# Patient Record
Sex: Male | Born: 1985 | Race: Black or African American | Hispanic: No | Marital: Single | Smoking: Current every day smoker
Health system: Southern US, Community
[De-identification: ages and names within clinical notes are randomized; demographics above are authoritative.]

## PROBLEM LIST (undated history)

## (undated) DIAGNOSIS — K219 Gastro-esophageal reflux disease without esophagitis: Secondary | ICD-10-CM

## (undated) HISTORY — PX: NO PAST SURGERIES: SHX2092

---
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
## 2007-04-06 ENCOUNTER — Emergency Department (HOSPITAL_COMMUNITY): Admission: EM | Admit: 2007-04-06 | Discharge: 2007-04-07 | Payer: Self-pay | Admitting: Emergency Medicine

## 2009-05-18 ENCOUNTER — Emergency Department (HOSPITAL_COMMUNITY): Admission: EM | Admit: 2009-05-18 | Discharge: 2009-05-18 | Payer: Self-pay | Admitting: Emergency Medicine

## 2009-06-04 ENCOUNTER — Emergency Department (HOSPITAL_COMMUNITY): Admission: EM | Admit: 2009-06-04 | Discharge: 2009-06-04 | Payer: Self-pay | Admitting: Emergency Medicine

## 2010-10-10 LAB — DIFFERENTIAL
Basophils Absolute: 0 10*3/uL (ref 0.0–0.1)
Basophils Relative: 0 % (ref 0–1)
Eosinophils Absolute: 0.1 10*3/uL (ref 0.0–0.7)
Lymphocytes Relative: 27 % (ref 12–46)
Lymphs Abs: 1.9 10*3/uL (ref 0.7–4.0)
Monocytes Absolute: 1.1 10*3/uL — ABNORMAL HIGH (ref 0.1–1.0)
Monocytes Relative: 15 % — ABNORMAL HIGH (ref 3–12)
Neutro Abs: 4.1 10*3/uL (ref 1.7–7.7)

## 2010-10-10 LAB — POCT I-STAT, CHEM 8
BUN: 13 mg/dL (ref 6–23)
HCT: 46 % (ref 39.0–52.0)
Hemoglobin: 15.6 g/dL (ref 13.0–17.0)
TCO2: 26 mmol/L (ref 0–100)

## 2010-10-10 LAB — CBC
HCT: 44.6 % (ref 39.0–52.0)
MCHC: 35 g/dL (ref 30.0–36.0)
MCV: 95 fL (ref 78.0–100.0)
RDW: 13.1 % (ref 11.5–15.5)
WBC: 7.3 10*3/uL (ref 4.0–10.5)

## 2010-10-10 LAB — D-DIMER, QUANTITATIVE: D-Dimer, Quant: <0.22 ug/mL-FEU (ref 0.00–0.48)

## 2013-04-05 ENCOUNTER — Emergency Department (HOSPITAL_COMMUNITY)
Admission: EM | Admit: 2013-04-05 | Discharge: 2013-04-05 | Disposition: A | Discharge status: Home / Self Care | Payer: No Typology Code available for payment source | Attending: Emergency Medicine | Admitting: Emergency Medicine

## 2013-04-05 ENCOUNTER — Encounter (HOSPITAL_COMMUNITY): Payer: Self-pay | Admitting: Nurse Practitioner

## 2013-04-05 DIAGNOSIS — S0993XA Unspecified injury of face, initial encounter: Secondary | ICD-10-CM | POA: Insufficient documentation

## 2013-04-05 DIAGNOSIS — Y9389 Activity, other specified: Secondary | ICD-10-CM | POA: Insufficient documentation

## 2013-04-05 DIAGNOSIS — G44309 Post-traumatic headache, unspecified, not intractable: Secondary | ICD-10-CM

## 2013-04-05 DIAGNOSIS — F0781 Postconcussional syndrome: Secondary | ICD-10-CM | POA: Insufficient documentation

## 2013-04-05 DIAGNOSIS — Y92410 Unspecified street and highway as the place of occurrence of the external cause: Secondary | ICD-10-CM | POA: Insufficient documentation

## 2013-04-05 MED ORDER — HYDROCODONE-ACETAMINOPHEN 5-325 MG PO TABS
1.0000 | ORAL_TABLET | ORAL | Status: DC | PRN
Start: 2013-04-05 — End: 2013-08-14
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
## 2013-04-05 NOTE — ED Provider Notes (Signed)
Medical screening examination/treatment/procedure(s) were performed by non-physician practitioner and as supervising physician I was immediately available for consultation/collaboration.  Adelin Ventrella N Kaleen Rochette, DO 04/05/13 1608 
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
## 2013-08-14 ENCOUNTER — Ambulatory Visit: Payer: Self-pay | Admitting: Physician Assistant

## 2013-08-14 VITALS — BP 120/76 | HR 84 | Temp 98.1°F | Resp 16 | Ht 67.0 in | Wt 226.0 lb

## 2013-08-14 DIAGNOSIS — Z0289 Encounter for other administrative examinations: Secondary | ICD-10-CM

## 2013-08-14 NOTE — Progress Notes (Signed)
   Subjective:    Patient ID: Donald Daniels, male    DOB: 02/18/1986, 28 y.o.   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  He reporst that he has no medical problems.  Takes multivitamins and otc prilosec prn.  No prescription medications.  No previous surgeries or injuries.  No corrective lenses.  Reports that he is in good health.   Review of Systems  Constitutional: Negative for fever and chills.  HENT: Negative.   Respiratory: Negative.   Cardiovascular: Negative.   Gastrointestinal: Negative.   Musculoskeletal: Negative.   Skin: Negative.        Objective:   Physical Exam  Vitals reviewed. Constitutional: He is oriented to person, place, and time. He appears well-developed and well-nourished. No distress.  HENT:  Head: Normocephalic and atraumatic.  Right Ear: Tympanic membrane and ear canal normal.  Left Ear: Tympanic membrane and ear canal normal.  Mouth/Throat: Uvula is midline, oropharynx is clear and moist and mucous membranes are normal.  Eyes: Conjunctivae and EOM are normal. Pupils are equal, round, and reactive to light.  Neck: Normal range of motion. Neck supple.  Cardiovascular: Normal rate, regular rhythm, normal heart sounds and intact distal pulses.   Pulmonary/Chest: Effort normal and breath sounds normal. He has no wheezes. He has no rales.  Abdominal: Soft. Bowel sounds are normal. There is no tenderness. No hernia. Hernia confirmed negative in the right inguinal area and confirmed negative in the left inguinal area.  Lymphadenopathy:    He has no cervical adenopathy.  Neurological: He is alert and oriented to person, place, and time. He has normal reflexes.  Skin: Skin is warm and dry.  Psychiatric: He has a normal mood and affect. His behavior is normal.        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  Exam is normal and he  appears to be in good health.  Ok to certify for 2 yrs.  PPW and card completed.  Follow up as needed.  Loleta DickerE. Elizabeth Elea Holtzclaw MHS, PA-C [facility] 2/8/20151:29 PM

## 2016-09-14 ENCOUNTER — Encounter (HOSPITAL_COMMUNITY): Payer: Self-pay | Admitting: Emergency Medicine

## 2016-09-14 ENCOUNTER — Emergency Department (HOSPITAL_COMMUNITY)
Admission: EM | Admit: 2016-09-14 | Discharge: 2016-09-14 | Disposition: A | Discharge status: Home / Self Care | Payer: Self-pay | Attending: Emergency Medicine | Admitting: Emergency Medicine

## 2016-09-14 ENCOUNTER — Emergency Department (HOSPITAL_COMMUNITY): Payer: Self-pay

## 2016-09-14 DIAGNOSIS — Y939 Activity, unspecified: Secondary | ICD-10-CM | POA: Insufficient documentation

## 2016-09-14 DIAGNOSIS — Y999 Unspecified external cause status: Secondary | ICD-10-CM | POA: Insufficient documentation

## 2016-09-14 DIAGNOSIS — Y929 Unspecified place or not applicable: Secondary | ICD-10-CM | POA: Insufficient documentation

## 2016-09-14 DIAGNOSIS — S62346A Nondisplaced fracture of base of fifth metacarpal bone, right hand, initial encounter for closed fracture: Secondary | ICD-10-CM | POA: Insufficient documentation

## 2016-09-14 DIAGNOSIS — S62314A Displaced fracture of base of fourth metacarpal bone, right hand, initial encounter for closed fracture: Secondary | ICD-10-CM | POA: Insufficient documentation

## 2016-09-14 DIAGNOSIS — S62318A Displaced fracture of base of other metacarpal bone, initial encounter for closed fracture: Secondary | ICD-10-CM

## 2016-09-14 DIAGNOSIS — Z87891 Personal history of nicotine dependence: Secondary | ICD-10-CM | POA: Insufficient documentation

## 2016-09-14 DIAGNOSIS — Z23 Encounter for immunization: Secondary | ICD-10-CM | POA: Insufficient documentation

## 2016-09-14 DIAGNOSIS — W228XXA Striking against or struck by other objects, initial encounter: Secondary | ICD-10-CM | POA: Insufficient documentation

## 2016-09-14 MED ORDER — OXYCODONE-ACETAMINOPHEN 5-325 MG PO TABS: 2.0000 | ORAL_TABLET | ORAL | 0 refills | Status: AC | PRN

## 2016-09-14 MED ORDER — MORPHINE SULFATE (PF) 4 MG/ML IV SOLN
4.0000 mg | Freq: Once | INTRAVENOUS | Status: AC
  Administered 2016-09-14: 4 mg via INTRAVENOUS
  Filled 2016-09-14: qty 1

## 2016-09-14 MED ORDER — IBUPROFEN 200 MG PO TABS
600.0000 mg | ORAL_TABLET | Freq: Once | ORAL | Status: AC
  Administered 2016-09-14: 600 mg via ORAL
  Filled 2016-09-14: qty 3

## 2016-09-14 MED ORDER — OXYCODONE-ACETAMINOPHEN 5-325 MG PO TABS
2.0000 | ORAL_TABLET | Freq: Once | ORAL | Status: AC
  Administered 2016-09-14: 2 via ORAL
  Filled 2016-09-14: qty 2

## 2016-09-14 MED ORDER — ONDANSETRON HCL 4 MG/2ML IJ SOLN
4.0000 mg | Freq: Once | INTRAMUSCULAR | Status: AC | PRN
  Administered 2016-09-14: 4 mg via INTRAVENOUS
  Filled 2016-09-14: qty 2

## 2016-09-14 MED ORDER — TETANUS-DIPHTH-ACELL PERTUSSIS 5-2.5-18.5 LF-MCG/0.5 IM SUSP
0.5000 mL | Freq: Once | INTRAMUSCULAR | Status: AC
  Administered 2016-09-14: 0.5 mL via INTRAMUSCULAR
  Filled 2016-09-14: qty 0.5
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
## 2016-09-14 NOTE — ED Provider Notes (Signed)
Patient was handed off to me at shift change by previous ED PA [place].  Please see her note for full HPI, ROS and PE. Briefly, patient presents to ED with right hand pain s/p punching a door.  X-rays showed 4th and 5th metacarpal fx with dislocation.  Dr. Merlyn LotKuzma evaluated patient and reduced dislocation in ED. Dr. Merlyn LotKuzma recommends ulnar gutter, repeat post-reduction films, discharge with analgesia and clinic f/u with him next week.   On my exam patient is getting ulnar gutter splint by ortho tech at bedside.  There is diffuse tenderness through 3rd-5th metacarpals of right hand associated with mild edema.  Patient had full active ROM of right digits with appropriate tenderness.  Sensation to light touch in medial, radial and ulnar nerve distribution intact.  Radial and ulnar pulse strong.    Discussed plan to discharge with patient who verbalizes understanding and plan to see Dr. Merlyn LotKuzma in office next week. Patient considered safe for discharge at this time.  Will send home with percocet, ibuprofen, rest, ice, elevation, and f/u in clinic.   11:24PM Post reduction films showed improved alignment and position at the metacarpal base fx.     Liberty HandyClaudia J Lasheena Frieze, PA-C 09/14/16 2351    Marily MemosJason Mesner, MD 09/16/16 Jacinta [id number]

## 2016-09-14 NOTE — Discharge Instructions (Signed)
Please take percocet for severe pain.   If your pain is mild/moderate you may take 600 mg ibuprofen + 1000 mg tylenol up to three times a day for pain control.  Call Dr. Merlyn LotKuzma to schedule a follow up appointment in clinic.

## 2016-09-14 NOTE — ED Notes (Signed)
Report to Oscar, RN.
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
## 2016-09-17 ENCOUNTER — Other Ambulatory Visit: Payer: Self-pay | Admitting: Orthopedic Surgery

## 2016-09-17 ENCOUNTER — Encounter (HOSPITAL_BASED_OUTPATIENT_CLINIC_OR_DEPARTMENT_OTHER): Payer: Self-pay | Admitting: *Deleted

## 2016-09-19 ENCOUNTER — Ambulatory Visit (HOSPITAL_BASED_OUTPATIENT_CLINIC_OR_DEPARTMENT_OTHER): Payer: Self-pay | Admitting: Anesthesiology

## 2016-09-19 ENCOUNTER — Encounter (HOSPITAL_BASED_OUTPATIENT_CLINIC_OR_DEPARTMENT_OTHER): Payer: Self-pay | Admitting: Anesthesiology

## 2016-09-19 ENCOUNTER — Ambulatory Visit (HOSPITAL_BASED_OUTPATIENT_CLINIC_OR_DEPARTMENT_OTHER)
Admission: RE | Admit: 2016-09-19 | Discharge: 2016-09-19 | Disposition: A | Discharge status: Home / Self Care | Payer: Self-pay | Source: Ambulatory Visit | Attending: Orthopedic Surgery | Admitting: Orthopedic Surgery

## 2016-09-19 ENCOUNTER — Encounter (HOSPITAL_BASED_OUTPATIENT_CLINIC_OR_DEPARTMENT_OTHER)
Admission: RE | Disposition: A | Discharge status: Home / Self Care | Payer: Self-pay | Source: Ambulatory Visit | Attending: Orthopedic Surgery

## 2016-09-19 DIAGNOSIS — S62314A Displaced fracture of base of fourth metacarpal bone, right hand, initial encounter for closed fracture: Secondary | ICD-10-CM | POA: Insufficient documentation

## 2016-09-19 DIAGNOSIS — W2209XA Striking against other stationary object, initial encounter: Secondary | ICD-10-CM | POA: Insufficient documentation

## 2016-09-19 DIAGNOSIS — K219 Gastro-esophageal reflux disease without esophagitis: Secondary | ICD-10-CM | POA: Insufficient documentation

## 2016-09-19 DIAGNOSIS — S62316A Displaced fracture of base of fifth metacarpal bone, right hand, initial encounter for closed fracture: Secondary | ICD-10-CM | POA: Insufficient documentation

## 2016-09-19 DIAGNOSIS — F17200 Nicotine dependence, unspecified, uncomplicated: Secondary | ICD-10-CM | POA: Insufficient documentation

## 2016-09-19 HISTORY — DX: Gastro-esophageal reflux disease without esophagitis: K21.9

## 2016-09-19 HISTORY — PX: CLOSED REDUCTION METACARPAL WITH PERCUTANEOUS PINNING: SHX5613

## 2016-09-19 SURGERY — CLOSED REDUCTION, FRACTURE, METACARPAL BONE, WITH PERCUTANEOUS PINNING
Anesthesia: General | Site: Finger | Laterality: Right

## 2016-09-19 MED ORDER — FENTANYL CITRATE (PF) 100 MCG/2ML IJ SOLN
50.0000 ug | INTRAMUSCULAR | Status: DC | PRN
  Administered 2016-09-19: 100 ug via INTRAVENOUS
  Administered 2016-09-19: 50 ug via INTRAVENOUS

## 2016-09-19 MED ORDER — KETOROLAC TROMETHAMINE 30 MG/ML IJ SOLN
INTRAMUSCULAR | Status: DC | PRN
  Administered 2016-09-19: 30 mg via INTRAVENOUS

## 2016-09-19 MED ORDER — OXYCODONE HCL 5 MG PO TABS
ORAL_TABLET | ORAL | Status: AC
  Filled 2016-09-19: qty 1

## 2016-09-19 MED ORDER — LIDOCAINE 2% (20 MG/ML) 5 ML SYRINGE
INTRAMUSCULAR | Status: AC
  Filled 2016-09-19: qty 5

## 2016-09-19 MED ORDER — CHLORHEXIDINE GLUCONATE 4 % EX LIQD: 60.0000 mL | Freq: Once | CUTANEOUS | Status: DC

## 2016-09-19 MED ORDER — DEXAMETHASONE SODIUM PHOSPHATE 10 MG/ML IJ SOLN
INTRAMUSCULAR | Status: AC
  Filled 2016-09-19: qty 1

## 2016-09-19 MED ORDER — LACTATED RINGERS IV SOLN
INTRAVENOUS | Status: DC
  Administered 2016-09-19: 12:00:00 via INTRAVENOUS

## 2016-09-19 MED ORDER — OXYCODONE HCL 5 MG/5ML PO SOLN: 5.0000 mg | Freq: Once | ORAL | Status: AC | PRN

## 2016-09-19 MED ORDER — DEXAMETHASONE SODIUM PHOSPHATE 4 MG/ML IJ SOLN
INTRAMUSCULAR | Status: DC | PRN
  Administered 2016-09-19: 10 mg via INTRAVENOUS

## 2016-09-19 MED ORDER — HYDROMORPHONE HCL 1 MG/ML IJ SOLN
INTRAMUSCULAR | Status: AC
  Filled 2016-09-19: qty 1

## 2016-09-19 MED ORDER — OXYCODONE-ACETAMINOPHEN 5-325 MG PO TABS: ORAL_TABLET | ORAL | 0 refills | Status: AC

## 2016-09-19 MED ORDER — FENTANYL CITRATE (PF) 100 MCG/2ML IJ SOLN
INTRAMUSCULAR | Status: AC
  Filled 2016-09-19: qty 2

## 2016-09-19 MED ORDER — OXYCODONE HCL 5 MG PO TABS
5.0000 mg | ORAL_TABLET | Freq: Once | ORAL | Status: AC | PRN
  Administered 2016-09-19: 5 mg via ORAL

## 2016-09-19 MED ORDER — MIDAZOLAM HCL 2 MG/2ML IJ SOLN
INTRAMUSCULAR | Status: AC
  Filled 2016-09-19: qty 2

## 2016-09-19 MED ORDER — KETOROLAC TROMETHAMINE 30 MG/ML IJ SOLN
INTRAMUSCULAR | Status: AC
  Filled 2016-09-19: qty 1

## 2016-09-19 MED ORDER — MEPERIDINE HCL 25 MG/ML IJ SOLN: 6.2500 mg | INTRAMUSCULAR | Status: DC | PRN

## 2016-09-19 MED ORDER — ONDANSETRON HCL 4 MG/2ML IJ SOLN
INTRAMUSCULAR | Status: AC
  Filled 2016-09-19: qty 2

## 2016-09-19 MED ORDER — LIDOCAINE 2% (20 MG/ML) 5 ML SYRINGE
INTRAMUSCULAR | Status: DC | PRN
  Administered 2016-09-19: 50 mg via INTRAVENOUS

## 2016-09-19 MED ORDER — PROPOFOL 10 MG/ML IV BOLUS
INTRAVENOUS | Status: DC | PRN
  Administered 2016-09-19: 200 mg via INTRAVENOUS

## 2016-09-19 MED ORDER — SCOPOLAMINE 1 MG/3DAYS TD PT72: 1.0000 | MEDICATED_PATCH | Freq: Once | TRANSDERMAL | Status: DC | PRN

## 2016-09-19 MED ORDER — CEFAZOLIN SODIUM-DEXTROSE 2-4 GM/100ML-% IV SOLN
INTRAVENOUS | Status: AC
  Filled 2016-09-19: qty 100

## 2016-09-19 MED ORDER — HYDROMORPHONE HCL 1 MG/ML IJ SOLN
0.2500 mg | INTRAMUSCULAR | Status: DC | PRN
  Administered 2016-09-19 (×2): 0.5 mg via INTRAVENOUS

## 2016-09-19 MED ORDER — PROMETHAZINE HCL 25 MG/ML IJ SOLN: 6.2500 mg | INTRAMUSCULAR | Status: DC | PRN

## 2016-09-19 MED ORDER — BUPIVACAINE HCL (PF) 0.25 % IJ SOLN
INTRAMUSCULAR | Status: DC | PRN
  Administered 2016-09-19: 8 mL

## 2016-09-19 MED ORDER — CEFAZOLIN SODIUM-DEXTROSE 2-4 GM/100ML-% IV SOLN
2.0000 g | INTRAVENOUS | Status: AC
  Administered 2016-09-19: 2 g via INTRAVENOUS

## 2016-09-19 MED ORDER — MIDAZOLAM HCL 2 MG/2ML IJ SOLN
1.0000 mg | INTRAMUSCULAR | Status: DC | PRN
  Administered 2016-09-19: 2 mg via INTRAVENOUS

## 2016-09-19 SURGICAL SUPPLY — 53 items
BANDAGE ACE 3X5.8 VEL STRL LF (GAUZE/BANDAGES/DRESSINGS) ×3 IMPLANT
BLADE MINI RND TIP GREEN BEAV (BLADE) IMPLANT
BLADE SURG 15 STRL LF DISP TIS (BLADE) ×2 IMPLANT
BLADE SURG 15 STRL SS (BLADE) ×6
BNDG CMPR 9X4 STRL LF SNTH (GAUZE/BANDAGES/DRESSINGS) ×1
BNDG ELASTIC 2X5.8 VLCR STR LF (GAUZE/BANDAGES/DRESSINGS) IMPLANT
BNDG ESMARK 4X9 LF (GAUZE/BANDAGES/DRESSINGS) ×3 IMPLANT
BNDG GAUZE ELAST 4 BULKY (GAUZE/BANDAGES/DRESSINGS) ×3 IMPLANT
CHLORAPREP W/TINT 26ML (MISCELLANEOUS) ×3 IMPLANT
CORDS BIPOLAR (ELECTRODE) ×3 IMPLANT
COVER BACK TABLE 60X90IN (DRAPES) ×3 IMPLANT
COVER MAYO STAND STRL (DRAPES) ×3 IMPLANT
CUFF TOURNIQUET SINGLE 18IN (TOURNIQUET CUFF) ×3 IMPLANT
DRAPE EXTREMITY T 121X128X90 (DRAPE) ×3 IMPLANT
DRAPE OEC MINIVIEW 54X84 (DRAPES) ×3 IMPLANT
DRAPE SURG 17X23 STRL (DRAPES) ×3 IMPLANT
GAUZE SPONGE 4X4 12PLY STRL (GAUZE/BANDAGES/DRESSINGS) ×3 IMPLANT
GAUZE XEROFORM 1X8 LF (GAUZE/BANDAGES/DRESSINGS) ×3 IMPLANT
GLOVE BIO SURGEON STRL SZ7.5 (GLOVE) ×3 IMPLANT
GLOVE BIOGEL PI IND STRL 8 (GLOVE) ×1 IMPLANT
GLOVE BIOGEL PI INDICATOR 8 (GLOVE) ×2
GOWN STRL REUS W/ TWL LRG LVL3 (GOWN DISPOSABLE) ×1 IMPLANT
GOWN STRL REUS W/ TWL XL LVL3 (GOWN DISPOSABLE) ×1 IMPLANT
GOWN STRL REUS W/TWL LRG LVL3 (GOWN DISPOSABLE) ×3
GOWN STRL REUS W/TWL XL LVL3 (GOWN DISPOSABLE) ×3
K-WIRE .045X4 (WIRE) ×8 IMPLANT
NDL HYPO 25X1 1.5 SAFETY (NEEDLE) IMPLANT
NEEDLE HYPO 22GX1.5 SAFETY (NEEDLE) IMPLANT
NEEDLE HYPO 25X1 1.5 SAFETY (NEEDLE) IMPLANT
NS IRRIG 1000ML POUR BTL (IV SOLUTION) ×3 IMPLANT
PACK BASIN DAY SURGERY FS (CUSTOM PROCEDURE TRAY) ×3 IMPLANT
PAD CAST 3X4 CTTN HI CHSV (CAST SUPPLIES) ×1 IMPLANT
PAD CAST 4YDX4 CTTN HI CHSV (CAST SUPPLIES) IMPLANT
PADDING CAST ABS 4INX4YD NS (CAST SUPPLIES) ×2
PADDING CAST ABS COTTON 4X4 ST (CAST SUPPLIES) ×1 IMPLANT
PADDING CAST COTTON 3X4 STRL (CAST SUPPLIES) ×3
PADDING CAST COTTON 4X4 STRL (CAST SUPPLIES)
SLEEVE SCD COMPRESS KNEE MED (MISCELLANEOUS) IMPLANT
SPLINT PLASTER CAST XFAST 3X15 (CAST SUPPLIES) IMPLANT
SPLINT PLASTER CAST XFAST 4X15 (CAST SUPPLIES) IMPLANT
SPLINT PLASTER XTRA FAST SET 4 (CAST SUPPLIES)
SPLINT PLASTER XTRA FASTSET 3X (CAST SUPPLIES)
STOCKINETTE 4X48 STRL (DRAPES) ×3 IMPLANT
SUT ETHILON 3 0 PS 1 (SUTURE) IMPLANT
SUT ETHILON 4 0 PS 2 18 (SUTURE) ×3 IMPLANT
SUT MERSILENE 4 0 P 3 (SUTURE) IMPLANT
SUT VIC AB 3-0 PS1 18 (SUTURE)
SUT VIC AB 3-0 PS1 18XBRD (SUTURE) IMPLANT
SUT VICRYL 4-0 PS2 18IN ABS (SUTURE) IMPLANT
SYR BULB 3OZ (MISCELLANEOUS) ×3 IMPLANT
SYR CONTROL 10ML LL (SYRINGE) IMPLANT
TOWEL OR 17X24 6PK STRL BLUE (TOWEL DISPOSABLE) ×6 IMPLANT
UNDERPAD 30X30 (UNDERPADS AND DIAPERS) ×3 IMPLANT

## 2016-09-19 NOTE — Anesthesia Preprocedure Evaluation (Signed)
Anesthesia Evaluation  Patient identified by MRN, date of birth, ID band Patient awake    Reviewed: Allergy & Precautions, NPO status , Patient's Chart, lab work & pertinent test results  Airway Mallampati: II  TM Distance: >3 FB Neck ROM: Full    Dental no notable dental hx.    Pulmonary neg pulmonary ROS, Current Smoker,    Pulmonary exam normal breath sounds clear to auscultation       Cardiovascular negative cardio ROS Normal cardiovascular exam Rhythm:Regular Rate:Normal     Neuro/Psych negative neurological ROS  negative psych ROS   GI/Hepatic negative GI ROS, Neg liver ROS, GERD  ,  Endo/Other  negative endocrine ROS  Renal/GU negative Renal ROS  negative genitourinary   Musculoskeletal negative musculoskeletal ROS (+)   Abdominal   Peds negative pediatric ROS (+)  Hematology negative hematology ROS (+)   Anesthesia Other Findings   Reproductive/Obstetrics negative OB ROS                             Anesthesia Physical Anesthesia Plan  ASA: II  Anesthesia Plan: General   Post-op Pain Management:    Induction: Intravenous  Airway Management Planned: LMA  Additional Equipment:   Intra-op Plan:   Post-operative Plan: Extubation in OR  Informed Consent: I have reviewed the patients History and Physical, chart, labs and discussed the procedure including the risks, benefits and alternatives for the proposed anesthesia with the patient or authorized representative who has indicated his/her understanding and acceptance.   Dental advisory given  Plan Discussed with: CRNA  Anesthesia Plan Comments:         Anesthesia Quick Evaluation

## 2016-09-19 NOTE — Anesthesia Postprocedure Evaluation (Signed)
Anesthesia Post Note  Patient: Rameses E Gritz  Procedure(s) Performed: Procedure(s) (LRB): CLOSED REDUCTION RIGHT RING AND SMALL CARPOMETACARPAL FRACTURE DISLOCATIONS AND PINNING (Right)  Patient location during evaluation: PACU Anesthesia Type: General Level of consciousness: awake and alert Pain management: pain level controlled Vital Signs Assessment: post-procedure vital signs reviewed and stable Respiratory status: spontaneous breathing, nonlabored ventilation and respiratory function stable Cardiovascular status: blood pressure returned to baseline and stable Postop Assessment: no signs of nausea or vomiting Anesthetic complications: no       Last Vitals:  Vitals:   09/19/16 1345 09/19/16 1400  BP: (!) 127/93 119/89  Pulse: 78 (!) 56  Resp: (!) 22 11  Temp:      Last Pain:  Vitals:   09/19/16 1345  TempSrc:   PainSc: 8                  Lowella CurbWarren Ray Slaton Reaser

## 2016-09-19 NOTE — Discharge Instructions (Addendum)

## 2016-09-19 NOTE — Transfer of Care (Signed)
Immediate Anesthesia Transfer of Care Note  Patient: Donald Daniels  Procedure(s) Performed: Procedure(s): CLOSED REDUCTION RIGHT RING AND SMALL CARPOMETACARPAL FRACTURE DISLOCATIONS AND PINNING (Right)  Patient Location: PACU  Anesthesia Type:General  Level of Consciousness: awake and sedated  Airway & Oxygen Therapy: Patient Spontanous Breathing and Patient connected to face mask oxygen  Post-op Assessment: Report given to RN and Post -op Vital signs reviewed and stable  Post vital signs: Reviewed and stable  Last Vitals:  Vitals:   09/19/16 1141  BP: 113/71  Pulse: 63  Resp: 18  Temp: 36.5 C    Last Pain:  Vitals:   09/19/16 1141  TempSrc: Oral  PainSc: 5       Patients Stated Pain Goal: 5 (09/19/16 1141)  Complications: No apparent anesthesia complications

## 2016-09-19 NOTE — H&P (Signed)
  Donald Daniels is an 31 y.o. male.   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  [facility] right ring and small cmc fracture dislocation.  Closed reduction in ED.  He wishes to undergo surgical fixation for stabilization of the fracture/dislocation.  Allergies:  Allergies  Allergen Reactions  . Nickel Rash    Past Medical History:  Diagnosis Date  . GERD (gastroesophageal reflux disease)     Past Surgical History:  Procedure Laterality Date  . NO PAST SURGERIES      Family History: Family History  Problem Relation Age of Onset  . Cancer Maternal Grandmother   . Diabetes Maternal Grandmother   . Diabetes Maternal Grandfather   . Cancer Paternal Grandmother   . Cancer Paternal Grandfather     Social History:   reports that he has been smoking.  He has a 5.00 pack-year smoking history. He has never used smokeless tobacco. He reports that he does not drink alcohol or use drugs.  Medications: No prescriptions prior to admission.    No results found for this or any previous visit (from the past 48 hour(s)).  No results found.   A comprehensive review of systems was negative.  Height 5\' 8"  (1.727 m), weight 102.5 kg (226 lb).  General appearance: alert, cooperative and appears stated age Head: Normocephalic, without obvious abnormality, atraumatic Neck: supple, symmetrical, trachea midline Resp: clear to auscultation bilaterally Cardio: regular rate and rhythm GI: non-tender Extremities: Intact sensation and capillary refill all digits.  +epl/fpl/io.  No wounds.  Pulses: 2+ and symmetric Skin: Skin color, texture, turgor normal. No rashes or lesions Neurologic: Grossly normal Incision/Wound:none  Assessment/Plan Right ring and small finger cmc fracture dislocation.  Plan CRPP.  Non operative and operative treatment options were discussed with the patient and patient wishes to  proceed with operative treatment. Risks, benefits, and alternatives of surgery were discussed and the patient agrees with the plan of care.   Arnett Duddy R 09/19/2016, 10:08 AM

## 2016-09-19 NOTE — Anesthesia Procedure Notes (Signed)
Procedure Name: LMA Insertion Performed by: Karol Liendo W Pre-anesthesia Checklist: Patient identified, Emergency Drugs available, Suction available and Patient being monitored Patient Re-evaluated:Patient Re-evaluated prior to inductionOxygen Delivery Method: Circle system utilized Preoxygenation: Pre-oxygenation with 100% oxygen Intubation Type: IV induction Ventilation: Mask ventilation without difficulty LMA: LMA inserted LMA Size: 5.0 Number of attempts: 1 Placement Confirmation: positive ETCO2 Tube secured with: Tape Dental Injury: Teeth and Oropharynx as per pre-operative assessment        

## 2016-09-19 NOTE — Op Note (Signed)
Donald Daniels:  Donald Daniels,                     ACCOUNT NO.:  [id number]  MEDICAL RECORD NO.:  [id number]  LOCATION:                                 FACILITY:  PHYSICIAN:  Donald LoaKevin Shenell Rogalski, MD             DATE OF BIRTH:  DATE OF PROCEDURE:  09/19/2016 DATE OF DISCHARGE:                              OPERATIVE REPORT   PREOPERATIVE DIAGNOSIS:  Right ring and small finger carpometacarpal Fractures and dislocations.  POSTOPERATIVE DIAGNOSIS:  Right ring and small finger carpometacarpal Fractures and dislocations.  PROCEDURE:   1. Closed reduction and percutaneous pinning of right ring finger carpometacarpal dislocation 2. Closed reduction and percutaneous pinning of right small finger carpometacarpal dislocation 3. Closed reduction and percutaneous pinning of right ring finger metacarpal fracture 4. Closed reduction and percutaneous pinning of right small finger metacarpal fracture   SURGEON:  Donald LoaKevin Sudie Bandel, MD.  ASSISTANT:  None.  ANESTHESIA:  General IV fluids per anesthesia flow sheet.  ESTIMATED BLOOD LOSS:  Minimal.  COMPLICATIONS:  None.  SPECIMENS:  None.  TOURNIQUET TIME:  23 minutes.  DISPOSITION:  Stable to PACU.  INDICATIONS:  Donald Daniels is a 31 year old male, who punched a wall over the weekend injuring his right hand.  He was seen at the emergency department where radiographs were taken revealing small and ring finger metacarpal base fracture-dislocations.  He was seen in the emergency room and closed reduction performed at that time as a temporizing measure.  He returns today for surgical fixation of the fracture- dislocations.  Risks, benefits, and alternatives of surgery were discussed including the risk of blood loss; infection; damage to nerves, vessels, tendons, ligaments, bone; failure of surgery; need for additional surgery; complications with wound healing; continued pain; nonunion; malunion; stiffness.  He voiced understanding of these risks and elected to  proceed.  OPERATIVE COURSE:  After being identified preoperatively by myself, the patient and I agreed upon procedure and site of procedure.  Surgical site was marked.  Risks, benefits, and alternatives of surgery were reviewed and he wished to proceed.  Surgical consent had been signed. He was given IV Ancef as preoperative antibiotic prophylaxis.  He was transferred to the operating room and placed on the operating room table in supine position with the right upper extremity on arm board.  General anesthesia was induced by anesthesiologist.  Right upper extremity was prepped and draped in normal sterile orthopedic fashion.  Surgical pause was performed between surgeons, anesthesia, and operating staff; and all were in agreement as to the patient, procedure, and site of procedure. Tourniquet at the proximal aspect of the extremity was inflated to 250 mmHg after exsanguination of the limb with an Esmarch bandage.  C-arm was used in AP lateral and oblique projections throughout the case to aid in reduction and hardware placement.  A closed reduction of the ring and small finger CMC dislocations was performed.  There was fracture at the base of each metacarpal as well.  A 0.045-inch K-wires were used. One was advanced obliquely through the ring finger metacarpal base into the capitate and one through the small finger metacarpal base crossing the fracture  and into the hamate.  This was adequate to stabilize the [name].  Two additional K-wires were then placed, one through the base of the small ring and long finger metacarpals and one from the small to ring finger metacarpal distal to the fractures.  This was adequate to stabilize the fracture-dislocations.  The pins were bent and cut short.  The pin sites were dressed with sterile Xeroform.  C-arm was used again in AP lateral and oblique projections to ensure appropriate reduction and position of hardware, which was the case.  The  pins were bent and cut short.  The surgical site was injected with 8 mL of 0.25% plain Marcaine to aid in postoperative analgesia.  The pin sites were then dressed with sterile 4x4s and wrapped with a Kerlix bandage.  A volar and dorsal slab splint including the long, ring, and small fingers was placed with the MPs flexed and the IPs extended.  This was wrapped with Kerlix and Ace bandage.  Tourniquet was deflated at 23 minutes. Fingertips were pink with brisk capillary refill after deflation of tourniquet.  Operative drapes were broken down.  The patient was awoken from anesthesia safely.  He was transferred back to stretcher and taken to PACU in stable condition.  I will see him back in the office in 1 week for postoperative followup.  I will give him a prescription for Percocet 5/325 one to two p.o. q.6 hours p.r.n. pain, dispensed #30.     Donald Loa, MD     KK/MEDQ  D:  09/19/2016  T:  09/19/2016  Job:  [id number]

## 2016-09-19 NOTE — Op Note (Signed)
[id number] 

## 2016-09-19 NOTE — Brief Op Note (Signed)
09/19/2016  1:15 PM  PATIENT:  Donald Daniels  31 y.o. male  PRE-OPERATIVE DIAGNOSIS:  RIGHT RING AND SMALL FINGER METACARPAL BASE FRACTURE AND DISLOCATIONS  POST-OPERATIVE DIAGNOSIS:  FINGER METACARPAL BASE FRACTURE AND DISLOCATIONS  PROCEDURE:  Procedure(s): CLOSED REDUCTION RIGHT RING AND SMALL CARPOMETACARPAL FRACTURE DISLOCATIONS AND PINNING (Right)  SURGEON:  Surgeon(s) and Role:    * Betha LoaKevin Jarone Ostergaard, MD - Primary  PHYSICIAN ASSISTANT:   ASSISTANTS: none   ANESTHESIA:   general  EBL:  Total I/O In: 600 [I.V.:600] Out: 5 [Blood:5]  BLOOD ADMINISTERED:none  DRAINS: none   LOCAL MEDICATIONS USED:  MARCAINE     SPECIMEN:  No Specimen  DISPOSITION OF SPECIMEN:  N/A  COUNTS:  YES  TOURNIQUET:   Total Tourniquet Time Documented: Upper Arm (Right) - 23 minutes Total: Upper Arm (Right) - 23 minutes   DICTATION: .Other Dictation: Dictation Number [id number]  PLAN OF CARE: Discharge to home after PACU  PATIENT DISPOSITION:  PACU - hemodynamically stable.

## 2016-09-20 ENCOUNTER — Encounter (HOSPITAL_BASED_OUTPATIENT_CLINIC_OR_DEPARTMENT_OTHER): Payer: Self-pay | Admitting: Orthopedic Surgery

## 2018-10-16 IMAGING — CR DG HAND COMPLETE 3+V*R*
3 series · 3 of 3 positions shown · non-contrast
Comparison: 09/14/2016 at [DATE]

CLINICAL DATA: Fourth and fifth metacarpal base fracture
-dislocation

EXAM:
RIGHT HAND - COMPLETE 3+ VIEW

[x hand pa right]
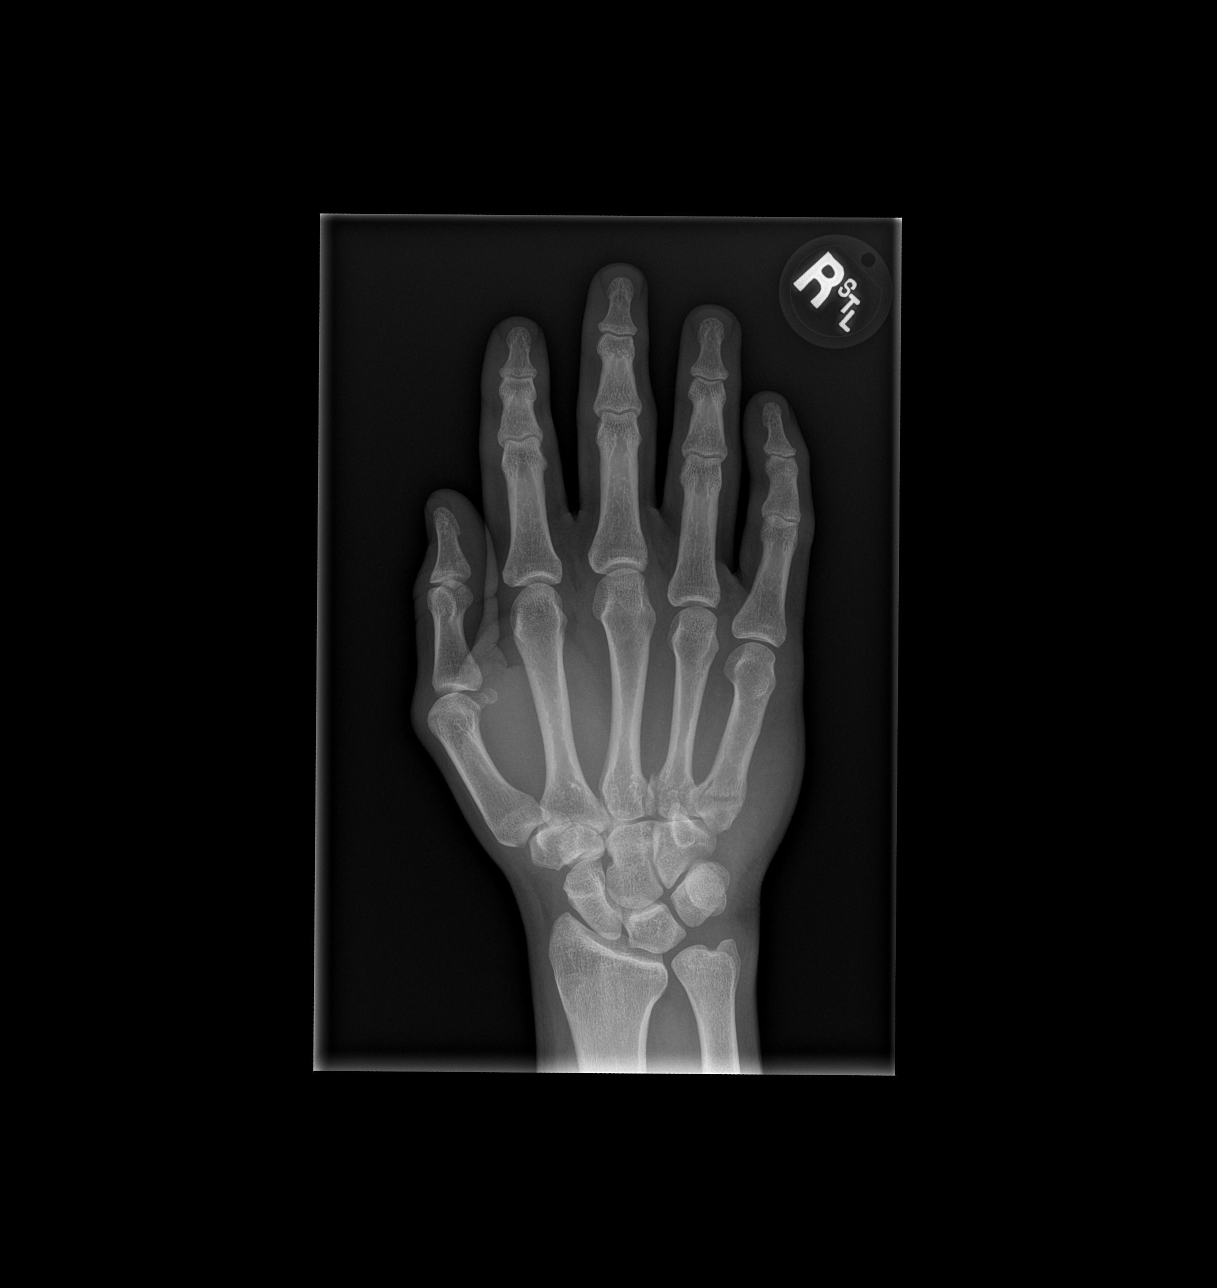

[x hand obl right]
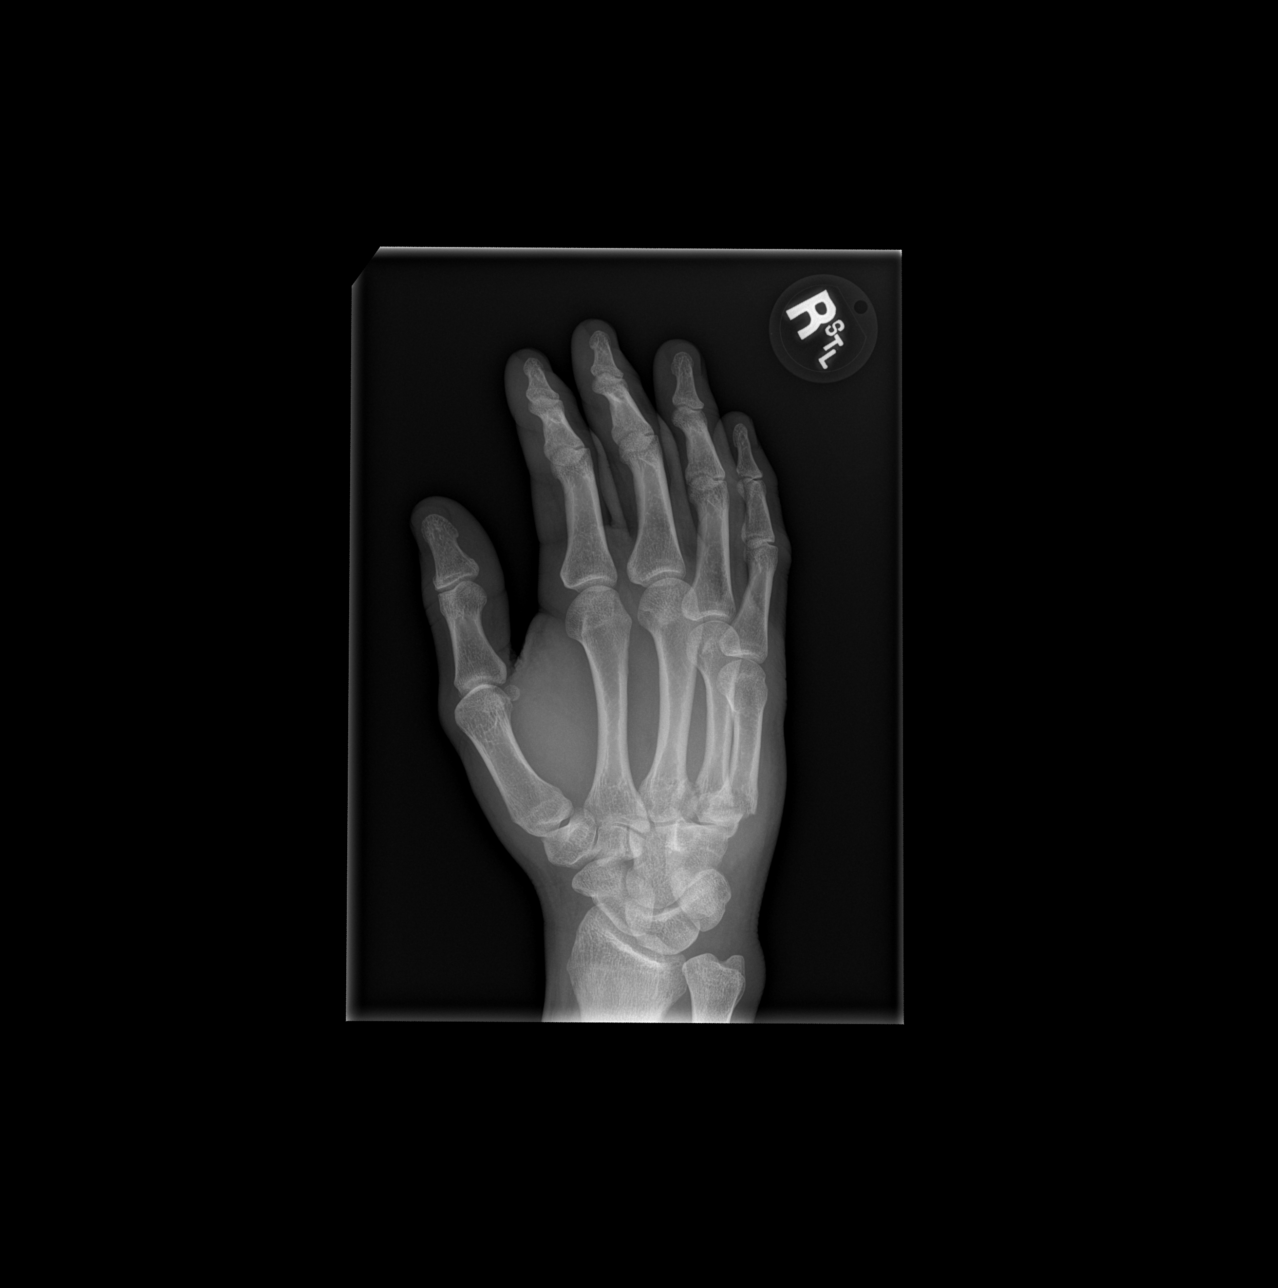

[x hand lat right]
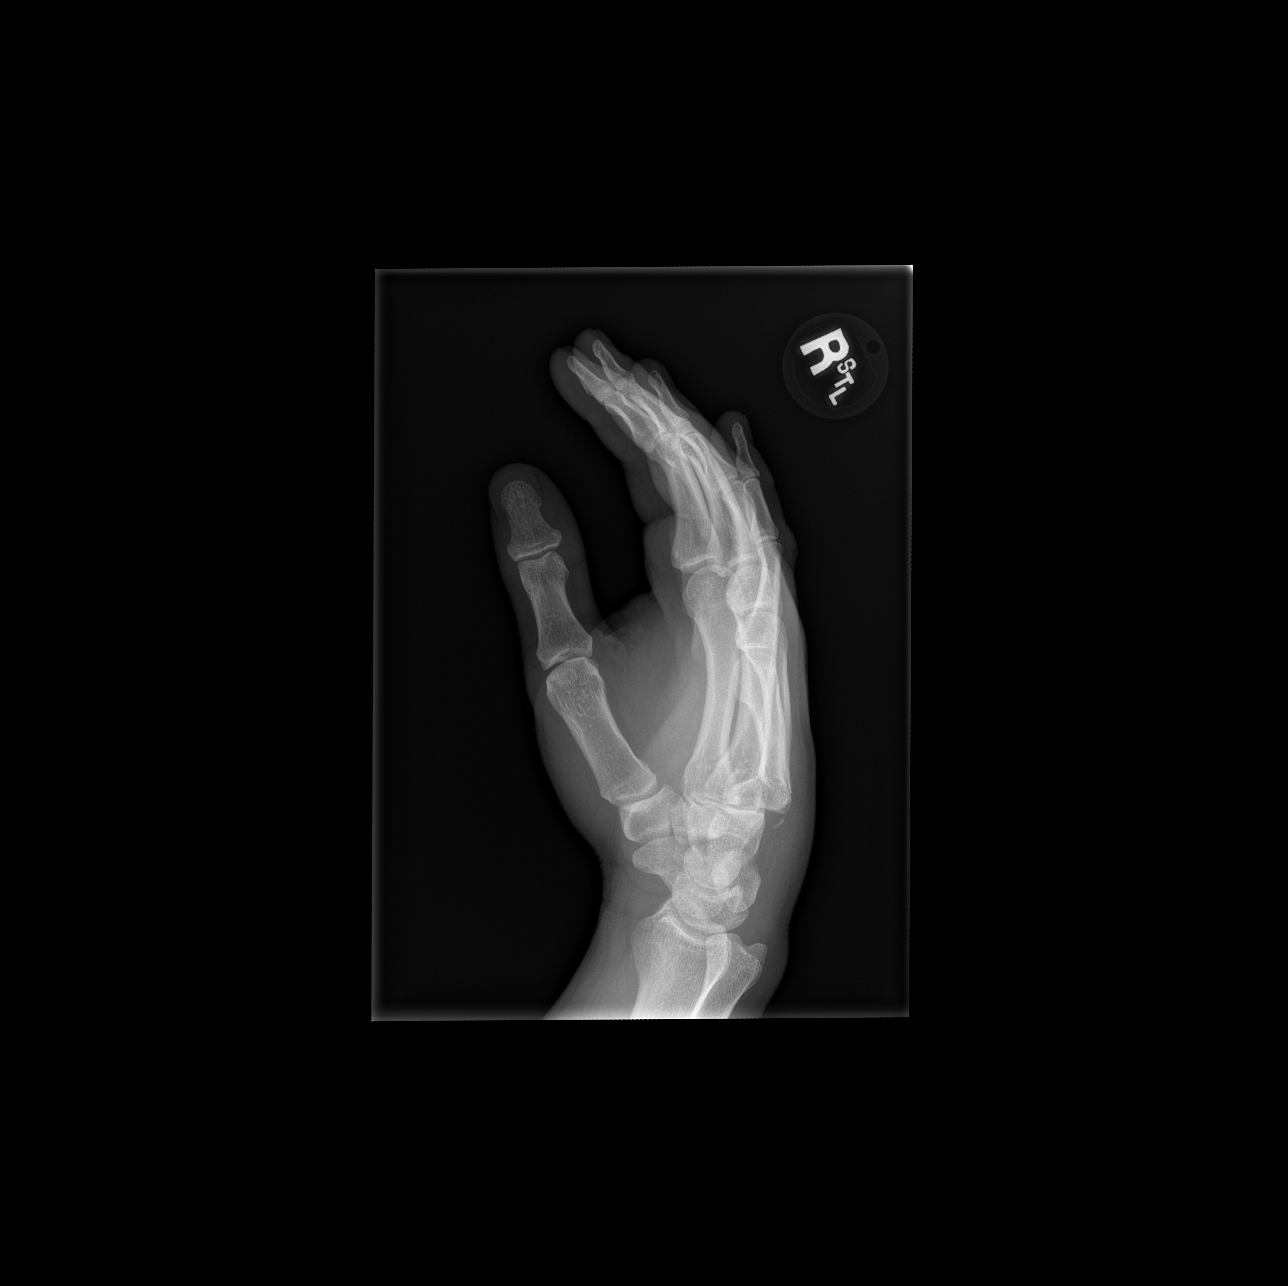

[3 of 3 positions shown; findings below may reference images not displayed]

FINDINGS: There is improved alignment at the fourth and fifth metacarpal base
fractures. There continues to be dorsal carpometacarpal dislocation,
and I believe this is the fifth carpometacarpal joint.
IMPRESSION: Persistent carpometacarpal dislocation dorsally, probably the fifth
CMC joint. Improved alignment and position at the metacarpal base
fractures.

## 2022-02-13 ENCOUNTER — Emergency Department (HOSPITAL_COMMUNITY)
Admission: EM | Admit: 2022-02-13 | Discharge: 2022-02-13 | Disposition: A | Discharge status: Home / Self Care | Payer: Self-pay

## 2022-02-13 NOTE — ED Notes (Signed)
Pt called 3x for triage, no answer ?

## 2022-02-13 NOTE — ED Triage Notes (Signed)
BIB EMS from work.  Called out for muscle cramping.  Vital signs HR 116, 144/90.  No shob.
